# Patient Record
Sex: Female | Born: 1975 | Hispanic: Yes | Marital: Married | Smoking: Never smoker
Health system: Southern US, Community
[De-identification: ages and names within clinical notes are randomized; demographics above are authoritative.]

## PROBLEM LIST (undated history)

## (undated) DIAGNOSIS — I10 Essential (primary) hypertension: Secondary | ICD-10-CM

## (undated) DIAGNOSIS — F419 Anxiety disorder, unspecified: Secondary | ICD-10-CM

## (undated) DIAGNOSIS — F32A Depression, unspecified: Secondary | ICD-10-CM

## (undated) DIAGNOSIS — F329 Major depressive disorder, single episode, unspecified: Secondary | ICD-10-CM

---
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
## 2012-10-20 ENCOUNTER — Ambulatory Visit: Payer: Self-pay | Admitting: Family Medicine

## 2012-11-10 ENCOUNTER — Ambulatory Visit: Payer: Self-pay | Admitting: Family Medicine

## 2013-04-13 ENCOUNTER — Ambulatory Visit: Payer: Self-pay

## 2017-07-12 ENCOUNTER — Ambulatory Visit
Admission: RE | Admit: 2017-07-12 | Discharge: 2017-07-12 | Disposition: A | Discharge status: Home / Self Care | Payer: Self-pay | Source: Ambulatory Visit | Attending: Oncology | Admitting: Oncology

## 2017-07-12 ENCOUNTER — Ambulatory Visit: Payer: Self-pay | Attending: Oncology | Admitting: *Deleted

## 2017-07-12 VITALS — BP 153/88 | HR 62 | Temp 98.5°F | Ht 63.0 in | Wt 146.0 lb

## 2017-07-12 DIAGNOSIS — Z0000 Encounter for general adult medical examination without abnormal findings: Secondary | ICD-10-CM

## 2017-07-12 NOTE — Progress Notes (Signed)
Subjective:     Patient ID: Laddie AquasMaria Torres [place], female   DOB: 01/28/1976, 42 y.o.   MRN: [id number]  HPI   Review of Systems     Objective:   Physical Exam  Pulmonary/Chest: Right breast exhibits no inverted nipple, no mass, no nipple discharge, no skin change and no tenderness. Left breast exhibits no inverted nipple, no mass, no nipple discharge, no skin change and no tenderness. Breasts are symmetrical.       Assessment:     42 year old Hispanic female referred to [facility] by [facility] for clinical breast exam and mammogram.  Aquilla SolianLloyda, the interpreter present during the interview and exam.  Clinical breast exam unremarkable.  Taught self breast awareness.  Last pap on 06/03/16 was negative with no HPV co-testing.  Next pap due in 2021. Blood pressure elevated at 153/88.  She is to recheck her blood pressure at [employer] or [facility], and if remains higher than 140/90 she is to follow-up with her primary care provider.  Hand out on hypertention given to patient. Patient has been screened for eligibility.  She does not have any insurance, Medicare or Medicaid.  She also meets financial eligibility.  Hand-out given on the Affordable Care Act.    Plan:     Screening mammogram ordered.  Will follow-up per [facility] protocol.

## 2017-07-12 NOTE — Patient Instructions (Signed)
Gave patient hand-out, Women Staying Healthy, Active and Well from [facility], with education on breast health, pap smears, heart and colon health. 

## 2017-07-15 ENCOUNTER — Encounter: Payer: Self-pay | Admitting: *Deleted

## 2017-07-15 NOTE — Progress Notes (Signed)
Letter mailed from the [facility] to inform patient of her normal mammogram results.  Patient is to follow-up with annual screening in one year.  HSIS to Christy. 

## 2019-01-16 ENCOUNTER — Other Ambulatory Visit: Payer: Self-pay

## 2019-01-16 DIAGNOSIS — Z20822 Contact with and (suspected) exposure to covid-19: Secondary | ICD-10-CM

## 2019-01-17 LAB — NOVEL CORONAVIRUS, NAA: SARS-CoV-2, NAA: NOT DETECTED

## 2019-03-08 ENCOUNTER — Other Ambulatory Visit: Payer: Self-pay

## 2019-03-08 ENCOUNTER — Encounter: Payer: Self-pay | Admitting: Emergency Medicine

## 2019-03-08 DIAGNOSIS — F419 Anxiety disorder, unspecified: Secondary | ICD-10-CM | POA: Insufficient documentation

## 2019-03-08 DIAGNOSIS — I10 Essential (primary) hypertension: Secondary | ICD-10-CM | POA: Insufficient documentation

## 2019-03-08 DIAGNOSIS — R0789 Other chest pain: Secondary | ICD-10-CM | POA: Insufficient documentation

## 2019-03-08 DIAGNOSIS — N390 Urinary tract infection, site not specified: Secondary | ICD-10-CM | POA: Insufficient documentation

## 2019-03-08 LAB — CBC WITH DIFFERENTIAL/PLATELET
Abs Immature Granulocytes: 0.06 10*3/uL (ref 0.00–0.07)
Basophils Absolute: 0 10*3/uL (ref 0.0–0.1)
Basophils Relative: 0 %
Eosinophils Absolute: 0.1 10*3/uL (ref 0.0–0.5)
Eosinophils Relative: 1 %
HCT: 40.9 % (ref 36.0–46.0)
Hemoglobin: 13.6 g/dL (ref 12.0–15.0)
Immature Granulocytes: 1 %
Lymphocytes Relative: 17 %
Lymphs Abs: 2.1 10*3/uL (ref 0.7–4.0)
MCH: 29.1 pg (ref 26.0–34.0)
MCHC: 33.3 g/dL (ref 30.0–36.0)
MCV: 87.4 fL (ref 80.0–100.0)
Monocytes Absolute: 0.7 10*3/uL (ref 0.1–1.0)
Monocytes Relative: 6 %
Neutro Abs: 9.7 10*3/uL — ABNORMAL HIGH (ref 1.7–7.7)
Neutrophils Relative %: 75 %
Platelets: 267 10*3/uL (ref 150–400)
RBC: 4.68 MIL/uL (ref 3.87–5.11)
RDW: 13.3 % (ref 11.5–15.5)
WBC: 12.6 10*3/uL — ABNORMAL HIGH (ref 4.0–10.5)
nRBC: 0 % (ref 0.0–0.2)

## 2019-03-08 NOTE — ED Triage Notes (Signed)
Patient ambulatory to triage with steady gait, without difficulty or distress noted, mask in place; per video interpreter, pt reports upper chest pressure and HA, st BP was high at home

## 2019-03-09 ENCOUNTER — Emergency Department: Payer: Self-pay

## 2019-03-09 ENCOUNTER — Emergency Department
Admission: EM | Admit: 2019-03-09 | Discharge: 2019-03-09 | Disposition: A | Discharge status: Home / Self Care | Payer: Self-pay | Attending: Emergency Medicine | Admitting: Emergency Medicine

## 2019-03-09 DIAGNOSIS — R079 Chest pain, unspecified: Secondary | ICD-10-CM

## 2019-03-09 DIAGNOSIS — F419 Anxiety disorder, unspecified: Secondary | ICD-10-CM

## 2019-03-09 DIAGNOSIS — N390 Urinary tract infection, site not specified: Secondary | ICD-10-CM

## 2019-03-09 DIAGNOSIS — I10 Essential (primary) hypertension: Secondary | ICD-10-CM

## 2019-03-09 HISTORY — DX: Essential (primary) hypertension: I10

## 2019-03-09 HISTORY — DX: Depression, unspecified: F32.A

## 2019-03-09 HISTORY — DX: Anxiety disorder, unspecified: F41.9

## 2019-03-09 LAB — URINALYSIS, COMPLETE (UACMP) WITH MICROSCOPIC
Bilirubin Urine: NEGATIVE
Glucose, UA: NEGATIVE mg/dL
Hgb urine dipstick: NEGATIVE
Ketones, ur: NEGATIVE mg/dL
Nitrite: NEGATIVE
Protein, ur: NEGATIVE mg/dL
Specific Gravity, Urine: 1.014 (ref 1.005–1.030)
pH: 5 (ref 5.0–8.0)

## 2019-03-09 LAB — COMPREHENSIVE METABOLIC PANEL
ALT: 20 U/L (ref 0–44)
AST: 23 U/L (ref 15–41)
Albumin: 4.6 g/dL (ref 3.5–5.0)
Alkaline Phosphatase: 77 U/L (ref 38–126)
Anion gap: 9 (ref 5–15)
BUN: 16 mg/dL (ref 6–20)
CO2: 23 mmol/L (ref 22–32)
Calcium: 9.7 mg/dL (ref 8.9–10.3)
Chloride: 107 mmol/L (ref 98–111)
Creatinine, Ser: 0.7 mg/dL (ref 0.44–1.00)
GFR calc Af Amer: >60 mL/min (ref >60)
GFR calc non Af Amer: >60 mL/min (ref >60)
Glucose, Bld: 124 mg/dL — ABNORMAL HIGH (ref 70–99)
Potassium: 3.7 mmol/L (ref 3.5–5.1)
Sodium: 139 mmol/L (ref 135–145)
Total Bilirubin: 0.7 mg/dL (ref 0.3–1.2)
Total Protein: 7.9 g/dL (ref 6.5–8.1)

## 2019-03-09 LAB — TROPONIN I (HIGH SENSITIVITY): Troponin I (High Sensitivity): <2 ng/L (ref <18)

## 2019-03-09 MED ORDER — CEPHALEXIN 500 MG PO CAPS: 500.0000 mg | ORAL_CAPSULE | Freq: Three times a day (TID) | ORAL | 0 refills | Status: AC

## 2019-03-09 MED ORDER — CEPHALEXIN 500 MG PO CAPS
500.0000 mg | ORAL_CAPSULE | Freq: Once | ORAL | Status: AC
  Administered 2019-03-09: 500 mg via ORAL
  Filled 2019-03-09: qty 1

## 2019-03-09 MED ORDER — LORAZEPAM 0.5 MG PO TABS: 0.5000 mg | ORAL_TABLET | Freq: Three times a day (TID) | ORAL | 0 refills | Status: AC | PRN

## 2019-03-09 NOTE — ED Notes (Signed)
Pt reports headache, left-sided chest pain, and left arm pain that started today, but reports 2 weeks "of not feeling well". Pt states HTN, but denies being r/x'd medications for same. No reports of fever or cough; no N/V/D.

## 2019-03-09 NOTE — ED Provider Notes (Signed)
[facility] Emergency Department Provider Note   ____________________________________________   First MD Initiated Contact with Patient 03/09/19 [phone]     (approximate)  I have reviewed the triage vital signs and the nursing notes.   HISTORY  Chief Complaint Chest Pain  History obtained via [employer] Spanish interpreter  HPI Allison [name] is a 43 y.o. female who presents to the ED from home with a chief complaint of elevated blood pressure, frontal headache and upper chest pain.  Patient has noticed the symptoms intermittently for the past 2 weeks.  Also endorses increased stressors at home as well as work.  Does not take anything for blood pressure and does not know what her baseline blood pressure is.  Denies fever, cough, shortness of breath, abdominal pain, nausea, vomiting.  Denies recent travel, trauma or hormone use.       Past Medical History:  Diagnosis Date  . Anxiety   . Depression   . Hypertension     There are no active problems to display for this patient.   History reviewed. No pertinent surgical history.  Prior to Admission medications   Not on File    Allergies Patient has no known allergies.  No family history on file.  Social History Social History   Tobacco Use  . Smoking status: Never Smoker  . Smokeless tobacco: Never Used  Substance Use Topics  . Alcohol use: Never    Frequency: Never  . Drug use: Not on file    Review of Systems  Constitutional: No fever/chills Eyes: No visual changes. ENT: No sore throat. Cardiovascular: Denies chest pain. Respiratory: Denies shortness of breath. Gastrointestinal: No abdominal pain.  No nausea, no vomiting.  No diarrhea.  No constipation. Genitourinary: Negative for dysuria. Musculoskeletal: Negative for back pain. Skin: Negative for rash. Neurological: Negative for headaches, focal weakness or numbness. Psychiatric: Positive for anxiety.  Negative for  SI/HI/AH/VH.  ____________________________________________   PHYSICAL EXAM:  VITAL SIGNS: ED Triage Vitals  Enc Vitals Group     BP 03/08/19 2336 (!) 166/80     Pulse Rate 03/08/19 2336 85     Resp 03/08/19 2336 18     Temp 03/08/19 2336 99.2 F (37.3 C)     Temp src --      SpO2 03/08/19 2336 100 %     Weight 03/08/19 2339 148 lb (67.1 kg)     Height --      Head Circumference --      Peak Flow --      Pain Score 03/08/19 2339 4     Pain Loc --      Pain Edu? --      Excl. in [place]? --     Constitutional: Alert and oriented. Well appearing and in no acute distress. Eyes: Conjunctivae are normal. PERRL. EOMI. Head: Atraumatic. Nose: No congestion/rhinnorhea. Mouth/Throat: Mucous membranes are moist.  Oropharynx non-erythematous. Neck: No stridor.   Cardiovascular: Normal rate, regular rhythm. Grossly normal heart sounds.  Good peripheral circulation. Respiratory: Normal respiratory effort.  No retractions. Lungs CTAB. Gastrointestinal: Soft and nontender. No distention. No abdominal bruits. No CVA tenderness. Musculoskeletal: No lower extremity tenderness nor edema.  No joint effusions. Neurologic:  Normal speech and language. No gross focal neurologic deficits are appreciated. No gait instability. Skin:  Skin is warm, dry and intact. No rash noted. Psychiatric: Mood and affect are normal. Speech and behavior are normal.  ____________________________________________   LABS (all labs ordered are listed, but only  abnormal results are displayed)  Labs Reviewed  CBC WITH DIFFERENTIAL/PLATELET - Abnormal; Notable for the following components:      Result Value   WBC 12.6 (*)    Neutro Abs 9.7 (*)    All other components within normal limits  COMPREHENSIVE METABOLIC PANEL - Abnormal; Notable for the following components:   Glucose, Bld 124 (*)    All other components within normal limits  URINALYSIS, COMPLETE (UACMP) WITH MICROSCOPIC - Abnormal; Notable for the following  components:   Color, Urine YELLOW (*)    APPearance HAZY (*)    Leukocytes,Ua MODERATE (*)    Bacteria, UA RARE (*)    All other components within normal limits  POC URINE PREG, ED  TROPONIN I (HIGH SENSITIVITY)   ____________________________________________  EKG  ED ECG REPORT I, Kevia Zaucha J, the attending physician, personally viewed and interpreted this ECG.   Date: 03/09/2019  EKG Time: 2338  Rate: 98  Rhythm: normal EKG, normal sinus rhythm  Axis: Normal  Intervals:none  ST&T Change: Nonspecific  ____________________________________________  RADIOLOGY  ED MD interpretation: No acute cardiopulmonary process  Official radiology report(s): Dg Chest 2 View  Result Date: 03/09/2019 CLINICAL DATA:  Chest pain and headache EXAM: CHEST - 2 VIEW COMPARISON:  10/20/2012 FINDINGS: The heart size and mediastinal contours are within normal limits. Both lungs are clear. The visualized skeletal structures are unremarkable. IMPRESSION: No active cardiopulmonary disease. Electronically Signed   By: Deatra Robinson M.D.   On: 03/09/2019 01:10    ____________________________________________   PROCEDURES  Procedure(s) performed (including Critical Care):  Procedures   ____________________________________________   INITIAL IMPRESSION / ASSESSMENT AND PLAN / ED COURSE  As part of my medical decision making, I reviewed the following data within the electronic MEDICAL RECORD NUMBER Nursing notes reviewed and incorporated, Interpreter needed, Labs reviewed, EKG interpreted, Old chart reviewed, Radiograph reviewed and Notes from prior ED visits     Allison [name] was evaluated in Emergency Department on 03/09/2019 for the symptoms described in the history of present illness. She was evaluated in the context of the global COVID-19 pandemic, which necessitated consideration that the patient might be at risk for infection with the SARS-CoV-2 virus that causes COVID-19. Institutional  protocols and algorithms that pertain to the evaluation of patients at risk for COVID-19 are in a state of rapid change based on information released by regulatory bodies including the CDC and federal and state organizations. These policies and algorithms were followed during the patient's care in the ED.    43 year old female who presents with elevated blood pressure, headache, chest pain and anxiety. Differential diagnosis includes, but is not limited to, ACS, aortic dissection, pulmonary embolism, cardiac tamponade, pneumothorax, pneumonia, pericarditis, myocarditis, GI-related causes including esophagitis/gastritis, and musculoskeletal chest wall pain.    Laboratory and EKG results unremarkable.  Will treat with Keflex for UTI.  Low-dose Ativan as needed for anxiety.  Patient will follow-up with both [facility] as well as her PCP.  Strict return precautions given.  Patient verbalizes understanding and agrees with plan of care.      ____________________________________________   FINAL CLINICAL IMPRESSION(S) / ED DIAGNOSES  Final diagnoses:  Nonspecific chest pain  Anxiety  Urinary tract infection without hematuria, site unspecified  Hypertension, unspecified type     ED Discharge Orders    None       Note:  This document was prepared using Dragon voice recognition software and may include unintentional dictation errors.   Irean Hong, MD  03/09/19 0602  

## 2019-03-09 NOTE — Discharge Instructions (Addendum)
1.  Your blood pressure is most likely elevated because of your stress and UTI. 2.  Take antibiotic as prescribed (Keflex 500 mg 3 times daily x7 days). 3.  You may take Ativan as needed for stress/anxiety. 4.  Return to the ER for worsening symptoms, persistent vomiting, difficulty breathing or other concerns.

## 2019-03-10 LAB — POCT PREGNANCY, URINE: Preg Test, Ur: NEGATIVE

## 2019-08-25 ENCOUNTER — Ambulatory Visit: Payer: Self-pay | Attending: Internal Medicine

## 2019-08-25 DIAGNOSIS — Z23 Encounter for immunization: Secondary | ICD-10-CM

## 2019-08-25 NOTE — Progress Notes (Signed)
   Covid-19 Vaccination Clinic  Name:  Allison [name]    MRN: [id number] DOB: [birth year]  08/25/2019  [name] was observed post Covid-19 immunization for 15 minutes without incident. She was provided with Vaccine Information Sheet and instruction to access the V-Safe system.   [name] was instructed to call 911 with any severe reactions post vaccine: Marland Kitchen Difficulty breathing  . Swelling of face and throat  . A fast heartbeat  . A bad rash all over body  . Dizziness and weakness   Immunizations Administered    Name Date Dose VIS Date Route   Pfizer COVID-19 Vaccine 08/25/2019  4:19 PM 0.3 mL 05/12/2019 Intramuscular   Manufacturer: [employer], [employer]   Lot: [id number]   NDC: [address]-1

## 2019-08-27 ENCOUNTER — Ambulatory Visit: Payer: Self-pay

## 2019-09-15 ENCOUNTER — Ambulatory Visit: Payer: Self-pay

## 2019-09-15 ENCOUNTER — Ambulatory Visit: Payer: Self-pay | Attending: Internal Medicine

## 2019-09-15 DIAGNOSIS — Z23 Encounter for immunization: Secondary | ICD-10-CM

## 2019-09-15 NOTE — Progress Notes (Signed)
   Covid-19 Vaccination Clinic  Name:  Allison [name]    MRN: [id number] DOB: 1976-02-11  09/15/2019  [name] was observed post Covid-19 immunization for 15 minutes without incident. She was provided with Vaccine Information Sheet and instruction to access the V-Safe system.   [name] was instructed to call 911 with any severe reactions post vaccine: Marland Kitchen Difficulty breathing  . Swelling of face and throat  . A fast heartbeat  . A bad rash all over body  . Dizziness and weakness   Immunizations Administered    Name Date Dose VIS Date Route   Pfizer COVID-19 Vaccine 09/15/2019  5:50 PM 0.3 mL 05/12/2019 Intramuscular   Manufacturer: [employer], [employer]   Lot: [id number]   NDC: [address]-8

## 2020-06-12 ENCOUNTER — Other Ambulatory Visit: Payer: Self-pay

## 2020-06-12 ENCOUNTER — Emergency Department
Admission: EM | Admit: 2020-06-12 | Discharge: 2020-06-12 | Disposition: A | Discharge status: Home / Self Care | Payer: Worker's Compensation | Attending: Emergency Medicine | Admitting: Emergency Medicine

## 2020-06-12 ENCOUNTER — Encounter: Payer: Self-pay | Admitting: Emergency Medicine

## 2020-06-12 ENCOUNTER — Ambulatory Visit: Admission: EM | Admit: 2020-06-12 | Discharge: 2020-06-12 | Discharge status: Against Medical Advice | Payer: Self-pay

## 2020-06-12 DIAGNOSIS — I10 Essential (primary) hypertension: Secondary | ICD-10-CM | POA: Diagnosis not present

## 2020-06-12 DIAGNOSIS — Y990 Civilian activity done for income or pay: Secondary | ICD-10-CM | POA: Diagnosis not present

## 2020-06-12 DIAGNOSIS — S6992XA Unspecified injury of left wrist, hand and finger(s), initial encounter: Secondary | ICD-10-CM | POA: Diagnosis not present

## 2020-06-12 DIAGNOSIS — W460XXA Contact with hypodermic needle, initial encounter: Secondary | ICD-10-CM

## 2020-06-12 DIAGNOSIS — W461XXA Contact with contaminated hypodermic needle, initial encounter: Secondary | ICD-10-CM | POA: Insufficient documentation

## 2020-06-12 NOTE — ED Triage Notes (Addendum)
C/O being stuck with a needle to left middle finger while cleaning on Monday.  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

## 2020-06-13 NOTE — ED Provider Notes (Signed)
[facility] Emergency Department Provider Note  ____________________________________________  Time seen: Approximately 9:24 PM  I have reviewed the triage vital signs and the nursing notes.   HISTORY  Chief Complaint No chief complaint on file.   HPI Allison [name] is a 45 y.o. female presents to the emergency department for treatment and evaluation after sustaining a needle stick while at work on Monday. She was cleaning a dirty trailer that had many insulin syringes laying around. She knows that someone who lived there was diabetic, but the police also mentioned that there may have been IV drug use in the home as well. She states that the home had been empty for about a month. She is unsure if the needle had been used or not. After she got stuck, she squeezed the area to make it bleed, then she washed her hands. The area has since healed, but she was told by her employer to come to the emergency department for evaluation.   Past Medical History:  Diagnosis Date  . Anxiety   . Depression   . Hypertension     There are no problems to display for this patient.   History reviewed. No pertinent surgical history.  Prior to Admission medications   Medication Sig Start Date End Date Taking? Authorizing Provider  cephALEXin (KEFLEX) 500 MG capsule Take 1 capsule (500 mg total) by mouth 3 (three) times daily. 03/09/19   Irean Hong, MD  LORazepam (ATIVAN) 0.5 MG tablet Take 1 tablet (0.5 mg total) by mouth every 8 (eight) hours as needed for anxiety. 03/09/19   Irean Hong, MD    Allergies Patient has no known allergies.  No family history on file.  Social History Social History   Tobacco Use  . Smoking status: Never Smoker  . Smokeless tobacco: Never Used  Substance Use Topics  . Alcohol use: Never    Review of Systems  Constitutional: Negative for fever. Respiratory: Negative for cough or shortness of breath.  Musculoskeletal: Negative for  myalgias Skin: Positive for needlestick to the left ring finger Neurological: Negative for numbness or paresthesias. ____________________________________________   PHYSICAL EXAM:  VITAL SIGNS: ED Triage Vitals  Enc Vitals Group     BP 06/12/20 0926 (!) 160/72     Pulse Rate 06/12/20 0926 75     Resp 06/12/20 0926 18     Temp 06/12/20 0926 98.5 F (36.9 C)     Temp Source 06/12/20 0926 Oral     SpO2 06/12/20 0926 100 %     Weight 06/12/20 0924 147 lb 14.9 oz (67.1 kg)     Height 06/12/20 0924 5\' 3"  (1.6 m)     Head Circumference --      Peak Flow --      Pain Score 06/12/20 0924 0     Pain Loc --      Pain Edu? --      Excl. in [place]? --      Constitutional: Well appearing. Eyes: Conjunctivae are clear without discharge or drainage. Nose: No rhinorrhea noted. Mouth/Throat: Airway is patent.  Neck: No stridor. Unrestricted range of motion observed. Cardiovascular: Capillary refill is <3 seconds.  Respiratory: Respirations are even and unlabored.. Musculoskeletal: Unrestricted range of motion observed. Neurologic: Awake, alert, and oriented x 4.  Skin: Evidence of a needlestick to the left ring finger.  ____________________________________________   LABS (all labs ordered are listed, but only abnormal results are displayed)  Labs Reviewed - No data to  display ____________________________________________  EKG  Not indicated. ____________________________________________  RADIOLOGY  Not indicated ____________________________________________   PROCEDURES  Procedures ____________________________________________   INITIAL IMPRESSION / ASSESSMENT AND PLAN / ED COURSE  [name] is a 45 y.o. female presents to the emergency department for treatment and evaluation after being stuck by a an insulin syringe in a home that she was cleaning.  See HPI for further details.  Annice Pih, interpreter, was used for exam and assessment.  Labs to be drawn were  explained to the patient.  We also discussed at length prophylaxis treatment.  Patient declines.  Feel that this is reasonable as there was no visible blood on the insulin needle and the house had been vacant for a month or more.  Patient was advised that she will need to have serial lab draws and that she should get in touch with her primary care provider to see if that can be done in the office or if she will need to be seen at [facility].  Patient was advised to return to the emergency department for symptoms of concern if she is unable to get an appointment with primary care.   Medications - No data to display   Pertinent labs & imaging results that were available during my care of the patient were reviewed by me and considered in my medical decision making (see chart for details).  ____________________________________________   FINAL CLINICAL IMPRESSION(S) / ED DIAGNOSES  Final diagnoses:  Needle stick, hypodermic, accidental, initial encounter    ED Discharge Orders    None       Note:  This document was prepared using Dragon voice recognition software and may include unintentional dictation errors.   Chinita Pester, FNP 06/13/20 2233    Sharman Cheek, MD 06/13/20 2258

## 2020-10-22 ENCOUNTER — Other Ambulatory Visit: Payer: Self-pay

## 2020-10-22 ENCOUNTER — Encounter: Payer: Self-pay | Admitting: *Deleted

## 2020-10-22 ENCOUNTER — Ambulatory Visit
Admission: RE | Admit: 2020-10-22 | Discharge: 2020-10-22 | Disposition: A | Discharge status: Home / Self Care | Payer: Self-pay | Source: Ambulatory Visit | Attending: Oncology | Admitting: Oncology

## 2020-10-22 ENCOUNTER — Ambulatory Visit: Payer: Self-pay | Attending: Oncology | Admitting: *Deleted

## 2020-10-22 VITALS — BP 151/72 | HR 81 | Temp 97.5°F | Ht 63.0 in | Wt 144.1 lb

## 2020-10-22 DIAGNOSIS — Z0000 Encounter for general adult medical examination without abnormal findings: Secondary | ICD-10-CM | POA: Insufficient documentation

## 2020-10-22 NOTE — Progress Notes (Signed)
  Subjective:     Patient ID: Allison [name], female   DOB: 09-20-1975, 45 y.o.   MRN: [id number]  HPI   [facility] Medical History Record - 10/22/20 1329      Breast History   Screening cycle Rescreen    CBE Date 07/12/17    Provider (CBE) [facility]    Initial Mammogram 10/22/20    Last Mammogram Annual    Last Mammogram Date 07/12/17    Provider (Mammogram)  Delford Field    Recent Breast Symptoms None      Breast Cancer History   Breast Cancer History No personal or family history      Previous History of Breast Problems   Breast Surgery or Biopsy None    Breast Implants N/A    BSE Done --   "sometimes"     Gynecological/Obstetrical History   LMP 10/13/20    Is there any chance that the client could be pregnant?  No    Age at menarche 21    Age at menopause na    PAP smear history Annually    Date of last PAP  06/03/16   scheduled for pap in Oct at the [facility]   Provider (PAP) [facility]    Age at first live birth 45    Breast fed children No    DES Exposure Unkown    Cervical, Uterine or Ovarian cancer No    Family history of Cervial, Uterine or Ovarian cancer No    Hysterectomy No    Cervix removed No    Ovaries removed No    Laser/Cryosurgery No    Current method of birth control None    Current method of Estrogen/Hormone replacement None    Smoking history None            Review of Systems     Objective:   Physical Exam Chest:  Breasts:     Right: No swelling, bleeding, inverted nipple, mass, nipple discharge, skin change, tenderness, axillary adenopathy or supraclavicular adenopathy.     Left: No swelling, bleeding, inverted nipple, mass, nipple discharge, skin change, tenderness, axillary adenopathy or supraclavicular adenopathy.     Lymphadenopathy:     Upper Body:     Right upper body: No supraclavicular or axillary adenopathy.     Left upper body: No supraclavicular or axillary adenopathy.        Assessment:     45 year old  Hispanic female returns to [facility] for annual screening.  [employer] interpreter Lillia Carmel [phone] used for interpretation during the interview and exam.  Clinical breast exam unremarkable.  Taught self breast awareness.  Last pap on 06/03/16 was negtive / without HPV co-testing.  Offered pap smear today.  Patient prefers to have her pap completed at the time of her physical exam in October with her PCP at the [facility].  Patient has been screened for eligibility.  She does not have any insurance, Medicare or Medicaid.  She also meets financial eligibility.   Risk Assessment    Risk Scores      10/22/2020   Last edited by: Jim Like, RN   5-year risk: 0.7 %   Lifetime risk: 9.4 %            Plan:     Screening mammogram ordered.  Will follow up per [facility] protocol.

## 2020-10-24 ENCOUNTER — Encounter: Payer: Self-pay | Admitting: *Deleted

## 2020-10-24 NOTE — Progress Notes (Signed)
Letter mailed from the [facility] to inform patient of her normal mammogram results.  Patient is to follow-up with annual screening in one year. 

## 2021-06-29 IMAGING — MG MM DIGITAL SCREENING BILAT W/ TOMO AND CAD
6 of 10 series · 6 of 30 positions shown · non-contrast
Comparison: Previous exam(s).

CLINICAL DATA: Screening.

EXAM:
DIGITAL SCREENING BILATERAL MAMMOGRAM WITH TOMOSYNTHESIS AND CAD
TECHNIQUE: Bilateral screening digital craniocaudal and mediolateral oblique
mammograms were obtained. Bilateral screening digital breast
tomosynthesis was performed. The images were evaluated with
computer-aided detection.

[L MLO synth-2D]
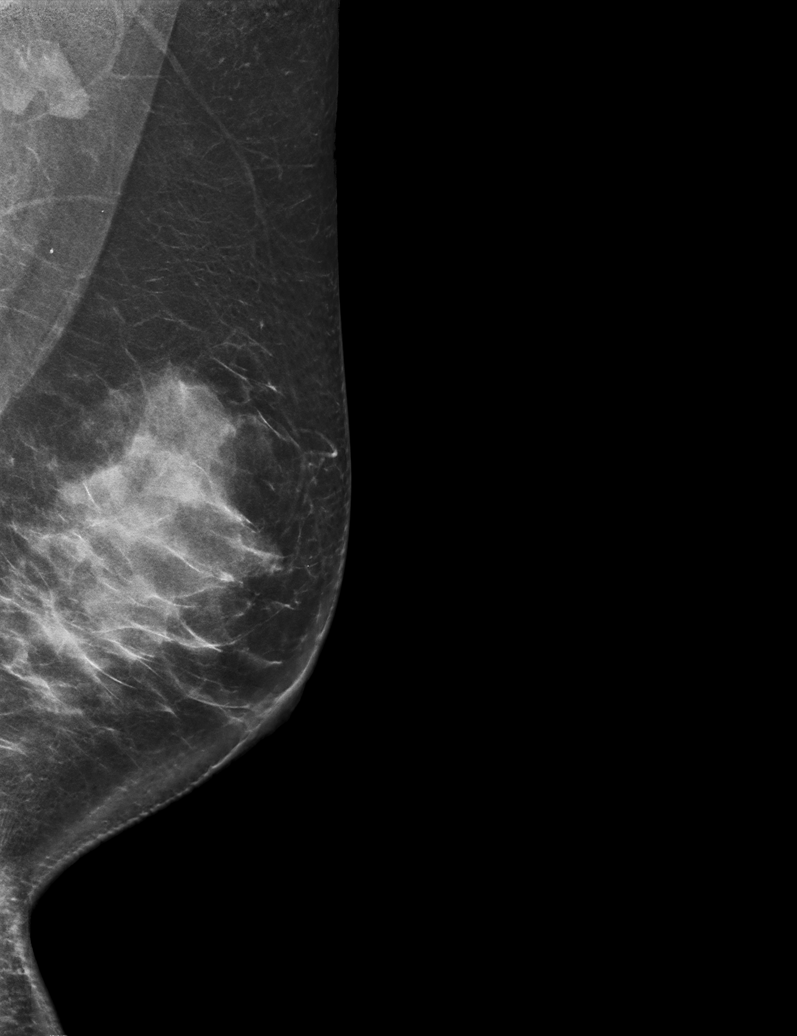

[L CC synth-2D]
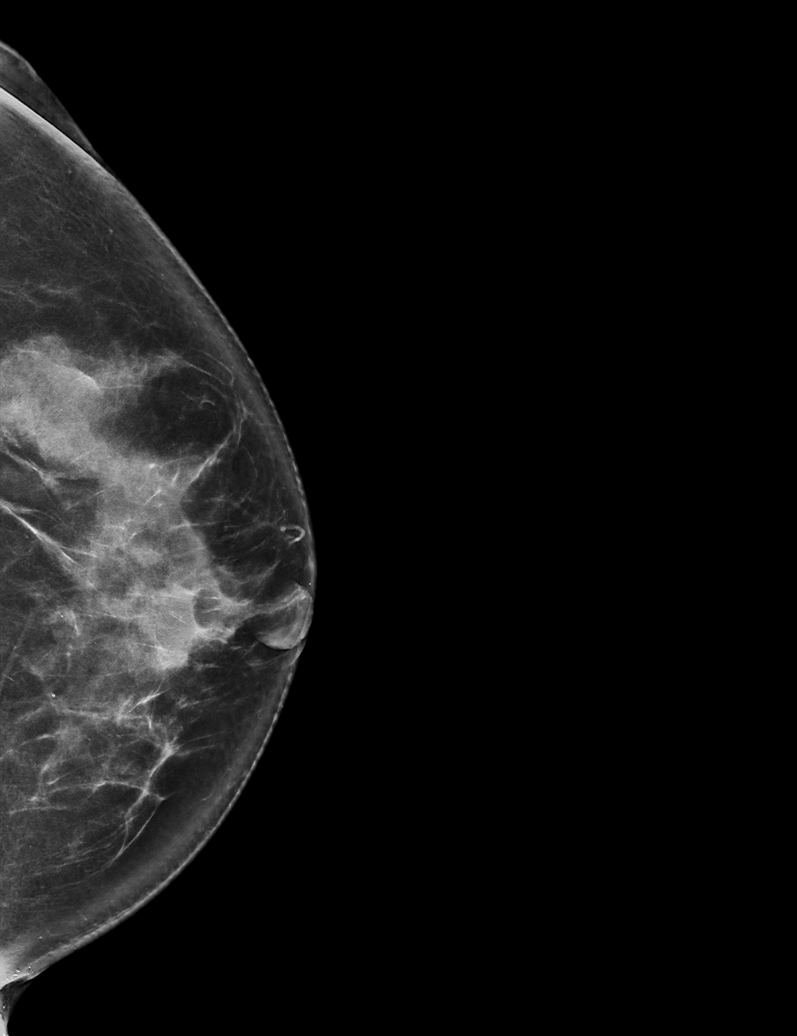

[L AT synth-2D]
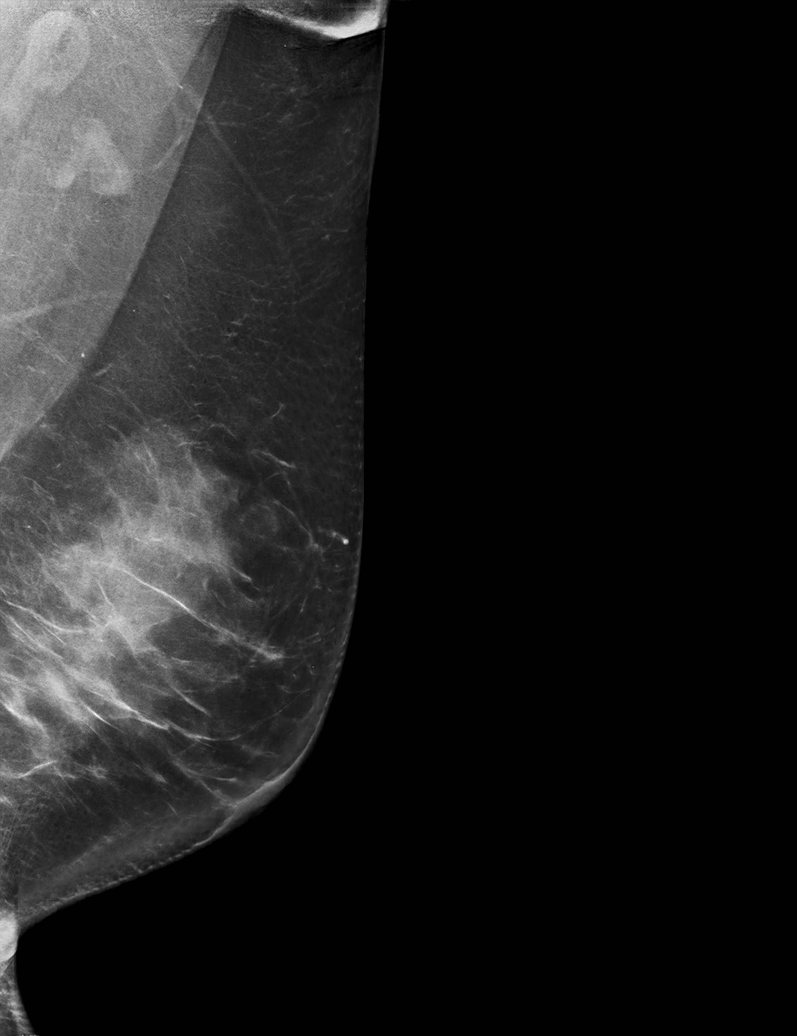

[R CC synth-2D]
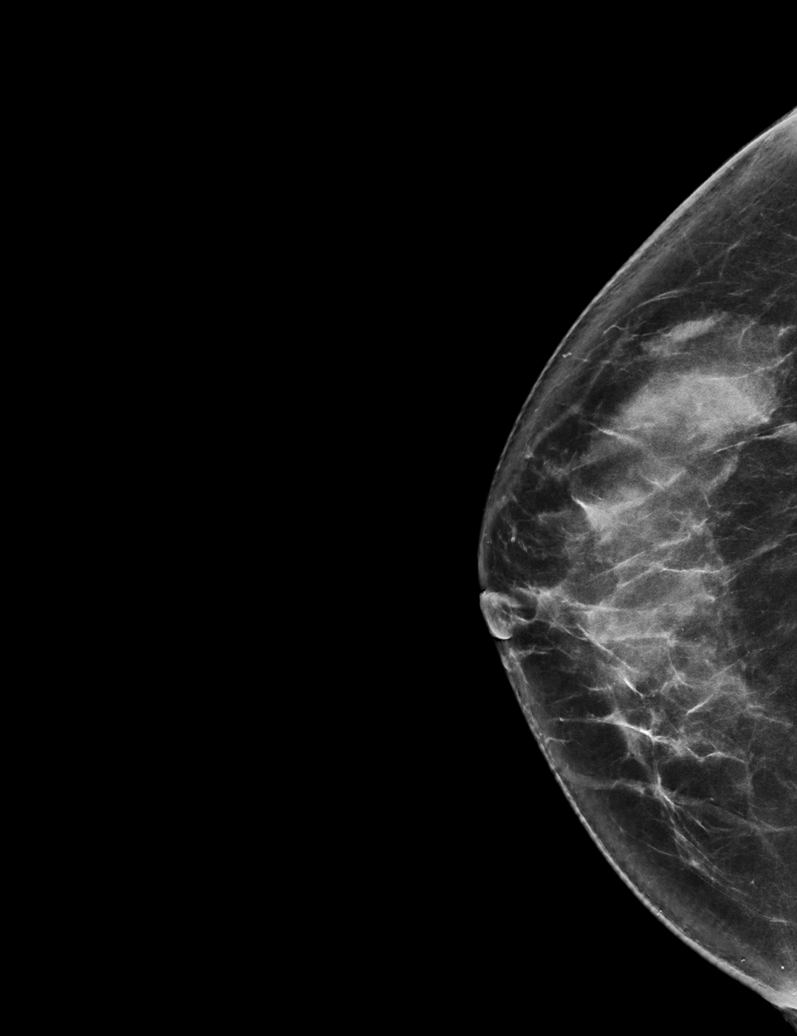

[R MLO synth-2D]
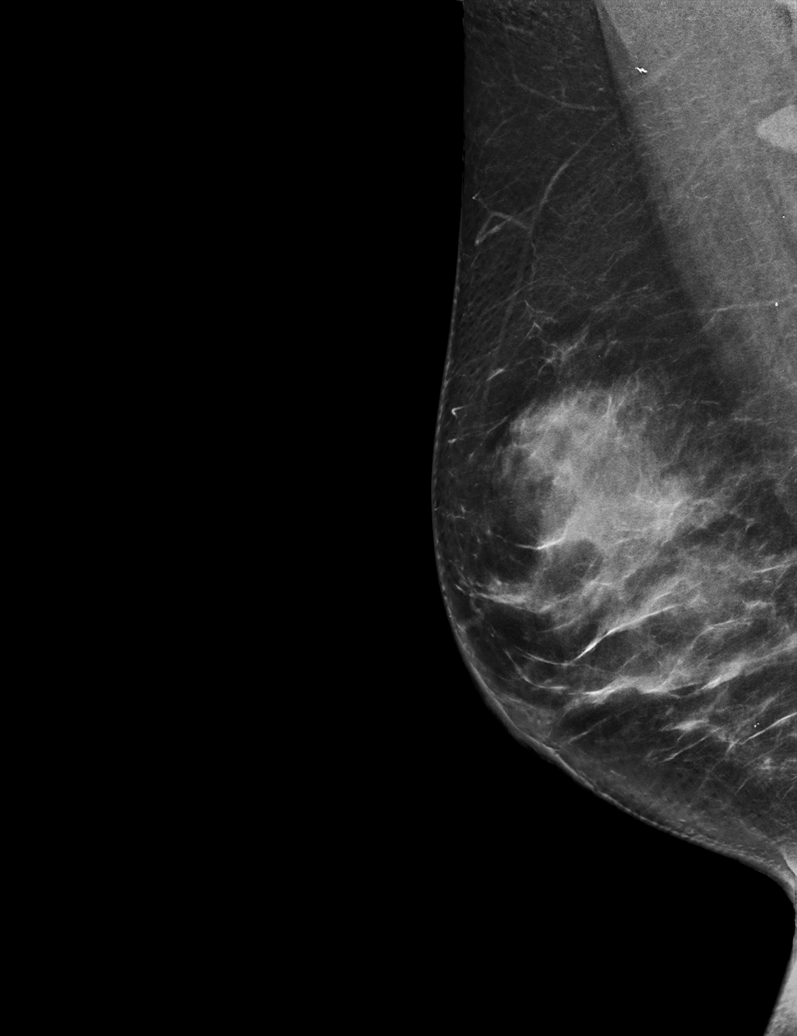

[L MLO tomo · tomo slice 42/83.0]
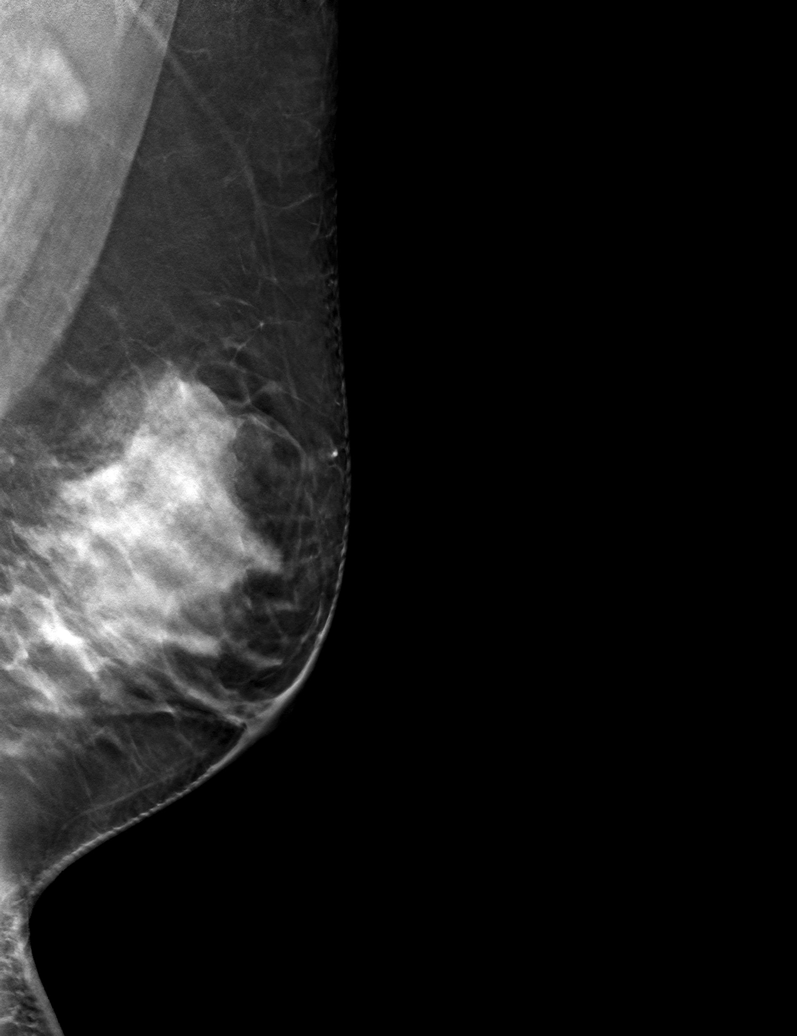

[6 of 30 positions shown; findings below may reference images not displayed]

ACR Breast Density Category c: The breast tissue is heterogeneously
dense, which may obscure small masses.
FINDINGS: There are no findings suspicious for malignancy. The images were
evaluated with computer-aided detection.
IMPRESSION: No mammographic evidence of malignancy. A result letter of this
screening mammogram will be mailed directly to the patient.

RECOMMENDATION:
Screening mammogram in one year. (Code:T4-5-GWO)

BI-RADS CATEGORY  1: Negative.

## 2021-12-23 ENCOUNTER — Encounter: Payer: Self-pay | Admitting: Family Medicine

## 2022-01-28 ENCOUNTER — Ambulatory Visit: Payer: Self-pay

## 2022-02-26 ENCOUNTER — Other Ambulatory Visit: Payer: Self-pay

## 2022-02-26 DIAGNOSIS — Z1231 Encounter for screening mammogram for malignant neoplasm of breast: Secondary | ICD-10-CM

## 2022-03-03 ENCOUNTER — Ambulatory Visit: Payer: Self-pay

## 2022-04-07 ENCOUNTER — Ambulatory Visit: Payer: Self-pay | Attending: Hematology and Oncology | Admitting: Hematology and Oncology

## 2022-04-07 ENCOUNTER — Ambulatory Visit
Admission: RE | Admit: 2022-04-07 | Discharge: 2022-04-07 | Disposition: A | Discharge status: Home / Self Care | Payer: Self-pay | Source: Ambulatory Visit | Attending: Obstetrics and Gynecology | Admitting: Obstetrics and Gynecology

## 2022-04-07 VITALS — BP 167/82 | Wt 156.1 lb

## 2022-04-07 DIAGNOSIS — Z1231 Encounter for screening mammogram for malignant neoplasm of breast: Secondary | ICD-10-CM | POA: Insufficient documentation

## 2022-04-07 NOTE — Progress Notes (Signed)
[name] is a 46 y.o. female who presents to [facility] clinic today with no complaints.    Pap Smear: Pap not smear completed today. Last Pap smear was 2020 at [facility] clinic and was normal. Per patient has no history of an abnormal Pap smear. Last Pap smear result is not available in Epic. She is scheduled for repeat pap smear on 04/25/22 at [facility] clinic.    Physical exam: Breasts Breasts symmetrical. No skin abnormalities bilateral breasts. No nipple retraction bilateral breasts. No nipple discharge bilateral breasts. No lymphadenopathy. No lumps palpated bilateral breasts.     MS DIGITAL SCREENING TOMO BILATERAL  Result Date: 10/23/2020 CLINICAL DATA:  Screening. EXAM: DIGITAL SCREENING BILATERAL MAMMOGRAM WITH TOMOSYNTHESIS AND CAD TECHNIQUE: Bilateral screening digital craniocaudal and mediolateral oblique mammograms were obtained. Bilateral screening digital breast tomosynthesis was performed. The images were evaluated with computer-aided detection. COMPARISON:  Previous exam(s). ACR Breast Density Category c: The breast tissue is heterogeneously dense, which may obscure small masses. FINDINGS: There are no findings suspicious for malignancy. The images were evaluated with computer-aided detection. IMPRESSION: No mammographic evidence of malignancy. A result letter of this screening mammogram will be mailed directly to the patient. RECOMMENDATION: Screening mammogram in one year. (Code:SM-B-01Y) BI-RADS CATEGORY  1: Negative. Electronically Signed   By: Lajean Manes M.D.   On: 10/23/2020 15:33   MS DIGITAL SCREENING TOMO BILATERAL  Result Date: 07/12/2017 CLINICAL DATA:  Screening. Baseline. EXAM: DIGITAL SCREENING BILATERAL MAMMOGRAM WITH TOMO AND CAD COMPARISON:  None. ACR Breast Density Category d: The breast tissue is extremely dense, which lowers the sensitivity of mammography. FINDINGS: There are no findings suspicious for malignancy. Images were processed with CAD.  IMPRESSION: No mammographic evidence of malignancy. A result letter of this screening mammogram will be mailed directly to the patient. RECOMMENDATION: Screening mammogram in one year. (Code:SM-B-01Y) BI-RADS CATEGORY  1: Negative. Electronically Signed   By: Evangeline Dakin M.D.   On: 07/12/2017 11:05      Pelvic/Bimanual Pap is not indicated today    Smoking History: Patient has never smoked and was not referred to quit line.    Patient Navigation: Patient education provided. Access to services provided for patient through [place] interpreter provided. No transportation provided   Colorectal Cancer Screening: Per patient has never had colonoscopy completed No complaints today. FIT test negative with [facility] 01/12/22.   Breast and Cervical Cancer Risk Assessment: Patient does not have family history of breast cancer, known genetic mutations, or radiation treatment to the chest before age 55. Patient does not have history of cervical dysplasia, immunocompromised, or DES exposure in-utero.  Risk Assessment     Risk Scores       04/07/2022 10/22/2020   Last edited by: Demetrius Revel, LPN Rico Junker, RN   5-year risk: 0.8 % 0.7 %   Lifetime risk: 9.2 % 9.4 %            A: [facility] exam without pap smear No complaints with benign exam.   P: [facility] to [facility] for a [facility] mamm[facility]. Appointment scheduled 04/07/2022.  Dayton Scrape A, NP 04/07/2022 1:25 PM

## 2022-04-07 NOTE — Patient Instructions (Signed)
[place] about breast self awareness. Patient did not need a Pap smear today due to last Pap smear was in 2020 per patient and she is scheduled for 04/25/22 at [facility] clinic. Let her know [facility] will cover Pap smears every 5 years unless has a history of abnormal Pap smears. Referred [facility] to the [facility] for screening mamm[facility]. Appointment scheduled for 04/07/2022. Patient aware of appointment and will be there. Let patient know will follow up with her within the next couple weeks with results. [place] verbalized understanding. She will return to clinic in one year for annual mammogram.   Melodye Ped, NP 1:08 PM

## 2023-08-11 ENCOUNTER — Other Ambulatory Visit: Payer: Self-pay

## 2023-08-11 DIAGNOSIS — Z1231 Encounter for screening mammogram for malignant neoplasm of breast: Secondary | ICD-10-CM

## 2023-09-06 ENCOUNTER — Ambulatory Visit
Admission: RE | Admit: 2023-09-06 | Discharge: 2023-09-06 | Disposition: A | Discharge status: Home / Self Care | Payer: Self-pay | Source: Ambulatory Visit | Attending: Obstetrics and Gynecology | Admitting: Obstetrics and Gynecology

## 2023-09-06 ENCOUNTER — Ambulatory Visit: Payer: Self-pay | Attending: Hematology and Oncology | Admitting: Hematology and Oncology

## 2023-09-06 VITALS — BP 150/92 | Wt 150.2 lb

## 2023-09-06 DIAGNOSIS — Z1231 Encounter for screening mammogram for malignant neoplasm of breast: Secondary | ICD-10-CM | POA: Insufficient documentation

## 2023-09-06 NOTE — Patient Instructions (Signed)
 Taught Allison [name] about self breast awareness and gave educational materials to take home. Patient did not need a Pap smear today due to last Pap smear was in 03/25/2022 per patient.  Let her know [facility] will cover Pap smears every 5 years unless has a history of abnormal Pap smears. Referred patient to the [facility] for screening mammogram. Appointment scheduled for 09/06/2023. Patient aware of appointment and will be there. Let patient know will follow up with her within the next couple weeks with results. [name] verbalized understanding.  Pascal Lux, NP 10:55 AM

## 2023-09-06 NOTE — Progress Notes (Signed)
 Ms. Allison Allison [name] is Allison [name] 48 y.o. female who presents to [facility] clinic today with no complaints.    Pap Smear: Pap not smear completed today. Last Pap smear was 03/25/2022 at [facility] clinic and was normal. Per patient has no history of an abnormal Pap smear. Last Pap smear result is available in Epic.   Physical exam: Breasts Breasts symmetrical. No skin abnormalities bilateral breasts. No nipple retraction bilateral breasts. No nipple discharge bilateral breasts. No lymphadenopathy. No lumps palpated bilateral breasts.       Pelvic/Bimanual Pap is not indicated today    Smoking History: Patient has never smoked and was not referred to quit line.    Patient Navigation: Patient education provided. Access to services provided for patient through [facility] program. Allison Allison [name] interpreter provided. No transportation provided   Colorectal Cancer Screening: Per patient has never had colonoscopy completed No complaints today.    Breast and Cervical Cancer Risk Assessment: Patient does not have family history of breast cancer, known genetic mutations, or radiation treatment to the chest before age 36. Patient does not have history of cervical dysplasia, immunocompromised, or DES exposure in-utero.  Risk Scores as of Encounter on 09/06/2023     Allison Allison [name]           5-year 1.21%   Lifetime 12.67%   This patient is Hisp[place]/Latina but has no documented birth Allison [name], so the [place] model used data from [place] patients to calculate their risk score. Document Allison Allison [name] in the Demographics activity for Allison [name] more accurate score.         Last calculated by Allison Rutherford, LPN on 06/06/1094 at 10:46 AM          Allison [name] [facility] exam without pap smear No complaints with benign exam.   P: Referred patient to the [facility] for Allison Allison [name]. Appointment scheduled 09/06/2023.  Allison Basset A, NP 09/06/2023 10:40 AM

## 2024-06-15 ENCOUNTER — Telehealth: Payer: Self-pay
